# Patient Record
Sex: Female | Born: 2000 | Hispanic: Yes | Smoking: Never smoker
Health system: Southern US, Community
[De-identification: ages and names within clinical notes are randomized; demographics above are authoritative.]

## PROBLEM LIST (undated history)

## (undated) DIAGNOSIS — Z789 Other specified health status: Secondary | ICD-10-CM

## (undated) HISTORY — DX: Other specified health status: Z78.9

## (undated) HISTORY — PX: NO PAST SURGERIES: SHX2092

---
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
## 2022-02-03 NOTE — L&D Delivery Note (Signed)
Kelly Simpson is a 22 y.o. female G1P0000 with IUP at [redacted]w[redacted]d admitted for spontaneous onset of labor.  She progressed with AROM augmentation to complete and pushed 1 hour to deliver.  Cord clamping delayed by several minutes then clamped by CNM and cut by FOB.    Delivery Note At 6:28 PM a viable female was delivered via Vaginal, Spontaneous (Presentation: LOA ).  APGAR: 8, 9; weight pending.   Placenta status: Spontaneous, Intact.  Cord: nuchal x1 and body x1, delivered through and reduced after.     Anesthesia: Epidural Episiotomy:  n/a Lacerations:  none Suture Repair:  n/a Est. Blood Loss (mL):  75  Mom to postpartum.  Baby to Couplet care / Skin to Skin.  Wende Mott CNM 04/18/2022, 6:39 PM

## 2022-02-05 ENCOUNTER — Ambulatory Visit (INDEPENDENT_AMBULATORY_CARE_PROVIDER_SITE_OTHER): Payer: Medicaid Other | Admitting: Obstetrics and Gynecology

## 2022-02-05 ENCOUNTER — Encounter: Payer: Self-pay | Admitting: Obstetrics and Gynecology

## 2022-02-05 ENCOUNTER — Other Ambulatory Visit: Payer: Medicaid Other

## 2022-02-05 VITALS — BP 113/73 | HR 82 | Wt 121.4 lb

## 2022-02-05 DIAGNOSIS — Z3483 Encounter for supervision of other normal pregnancy, third trimester: Secondary | ICD-10-CM | POA: Diagnosis not present

## 2022-02-05 DIAGNOSIS — Z3480 Encounter for supervision of other normal pregnancy, unspecified trimester: Secondary | ICD-10-CM

## 2022-02-05 DIAGNOSIS — Z1332 Encounter for screening for maternal depression: Secondary | ICD-10-CM

## 2022-02-05 DIAGNOSIS — Z3A28 28 weeks gestation of pregnancy: Secondary | ICD-10-CM

## 2022-02-05 NOTE — Progress Notes (Signed)
Patient presents for ROB visit. No concerns at this time.   

## 2022-02-05 NOTE — Progress Notes (Signed)
   LOW-RISK PREGNANCY OFFICE VISIT Patient name: Kelly Simpson MRN [id number]  Date of birth: October 03, 2000 Chief Complaint:   Routine Prenatal Visit  History of Present Illness:   Kelly Simpson is a 22 y.o. G36P0000 female at [redacted]w[redacted]d with an Estimated Date of Delivery: 04/28/22 being seen today for ongoing management of a low-risk pregnancy.  Today she reports no complaints. Contractions: Not present. Vag. Bleeding: None.  Movement: Present. denies leaking of fluid. Review of Systems:   Pertinent items are noted in HPI Denies abnormal vaginal discharge w/ itching/odor/irritation, headaches, visual changes, shortness of breath, chest pain, abdominal pain, severe nausea/vomiting, or problems with urination or bowel movements unless otherwise stated above. Pertinent History Reviewed:  Reviewed past medical,surgical, social, obstetrical and family history.  Reviewed problem list, medications and allergies. Physical Assessment:   Vitals:   02/05/22 0919  BP: 113/73  Pulse: 82  Weight: 121 lb 6.4 oz (55.1 kg)  Body mass index is 22.94 kg/m.        Physical Examination:   General appearance: Well appearing, and in no distress  Mental status: Alert, oriented to person, place, and time  Skin: Warm & dry  Cardiovascular: Normal heart rate noted  Respiratory: Normal respiratory effort, no distress  Abdomen: Soft, gravid, nontender  Pelvic: Cervical exam deferred         Extremities: Edema: None  Fetal Status: Fetal Heart Rate (bpm): 139 Fundal Height: 29 cm Movement: Present    No results found for this or any previous visit (from the past 24 hour(s)).  Assessment & Plan:  1) Low-risk pregnancy G1P0000 at [redacted]w[redacted]d with an Estimated Date of Delivery: 04/28/22   2) Supervision of other normal pregnancy, antepartum - Anticipatory guidance for upcoming appointments  3) [redacted] weeks gestation of pregnancy - CBC - Glucose Tolerance, 2 Hours w/1 Hour - HIV Antibody (routine testing  w rflx) - RPR  - Declined TdaP and flu vaccines   Meds: No orders of the defined types were placed in this encounter.  Labs/procedures today: 2 hr GTT and 3rd trimester  Plan:  Continue routine obstetrical care   Reviewed: Preterm labor symptoms and general obstetric precautions including but not limited to vaginal bleeding, contractions, leaking of fluid and fetal movement were reviewed in detail with the patient.  All questions were answered. Has home bp cuff. Check bp weekly, let us know if >140/90.   Follow-up: Return in about 2 weeks (around 02/19/2022) for Return OB visit.  Orders Placed This Encounter  Procedures   CBC   Glucose Tolerance, 2 Hours w/1 Hour   HIV Antibody (routine testing w rflx)   RPR   Laury Deep MSN, CNM 02/05/2022 9:40 AM

## 2022-02-06 LAB — HIV ANTIBODY (ROUTINE TESTING W REFLEX): HIV Screen 4th Generation wRfx: NONREACTIVE

## 2022-02-06 LAB — GLUCOSE TOLERANCE, 2 HOURS W/ 1HR
Glucose, 1 hour: 131 mg/dL (ref 70–179)
Glucose, 2 hour: 93 mg/dL (ref 70–152)
Glucose, Fasting: 75 mg/dL (ref 70–91)

## 2022-02-06 LAB — CBC
Hematocrit: 36.4 % (ref 34.0–46.6)
Hemoglobin: 11.8 g/dL (ref 11.1–15.9)
MCH: 27.4 pg (ref 26.6–33.0)
MCHC: 32.4 g/dL (ref 31.5–35.7)
MCV: 85 fL (ref 79–97)
Platelets: 281 10*3/uL (ref 150–450)
RBC: 4.31 x10E6/uL (ref 3.77–5.28)
RDW: 13.2 % (ref 11.7–15.4)
WBC: 11.2 10*3/uL — ABNORMAL HIGH (ref 3.4–10.8)

## 2022-02-06 LAB — RPR: RPR Ser Ql: NONREACTIVE

## 2022-02-19 ENCOUNTER — Ambulatory Visit (INDEPENDENT_AMBULATORY_CARE_PROVIDER_SITE_OTHER): Payer: Medicaid Other | Admitting: Obstetrics

## 2022-02-19 ENCOUNTER — Encounter: Payer: Self-pay | Admitting: Obstetrics

## 2022-02-19 VITALS — BP 118/67 | HR 89 | Wt 124.6 lb

## 2022-02-19 DIAGNOSIS — Z3480 Encounter for supervision of other normal pregnancy, unspecified trimester: Secondary | ICD-10-CM

## 2022-02-19 DIAGNOSIS — Z3A30 30 weeks gestation of pregnancy: Secondary | ICD-10-CM

## 2022-02-19 DIAGNOSIS — Z3483 Encounter for supervision of other normal pregnancy, third trimester: Secondary | ICD-10-CM

## 2022-02-19 NOTE — Progress Notes (Signed)
Pt presents for ROB visit. No concerns at this time.  

## 2022-02-19 NOTE — Progress Notes (Signed)
Subjective:  Kelly Simpson is a 22 y.o. G1P0000 at [redacted]w[redacted]d being seen today for ongoing prenatal care.  She is currently monitored for the following issues for this low-risk pregnancy and has Supervision of other normal pregnancy, antepartum on their problem list.  Patient reports no complaints.  Contractions: Not present. Vag. Bleeding: None.  Movement: Present. Denies leaking of fluid.   The following portions of the patient's history were reviewed and updated as appropriate: allergies, current medications, past family history, past medical history, past social history, past surgical history and problem list. Problem list updated.  Objective:   Vitals:   02/19/22 1627  BP: 118/67  Pulse: 89  Weight: 124 lb 9.6 oz (56.5 kg)    Fetal Status: Fetal Heart Rate (bpm): 152   Movement: Present     General:  Alert, oriented and cooperative. Patient is in no acute distress.  Skin: Skin is warm and dry. No rash noted.   Cardiovascular: Normal heart rate noted  Respiratory: Normal respiratory effort, no problems with respiration noted  Abdomen: Soft, gravid, appropriate for gestational age. Pain/Pressure: Absent     Pelvic:  Cervical exam deferred        Extremities: Normal range of motion.  Edema: None  Mental Status: Normal mood and affect. Normal behavior. Normal judgment and thought content.   Urinalysis:      Assessment and Plan:  Pregnancy: G1P0000 at [redacted]w[redacted]d  1. Supervision of other normal pregnancy, antepartum   Preterm labor symptoms and general obstetric precautions including but not limited to vaginal bleeding, contractions, leaking of fluid and fetal movement were reviewed in detail with the patient. Please refer to After Visit Summary for other counseling recommendations.   Return in about 4 weeks (around 03/19/2022) for ROB.   Shelly Bombard, MD [phone]

## 2022-03-05 ENCOUNTER — Encounter: Payer: Self-pay | Admitting: Obstetrics

## 2022-03-05 ENCOUNTER — Ambulatory Visit (INDEPENDENT_AMBULATORY_CARE_PROVIDER_SITE_OTHER): Payer: Medicaid Other | Admitting: Obstetrics

## 2022-03-05 VITALS — BP 111/69 | HR 79 | Wt 128.9 lb

## 2022-03-05 DIAGNOSIS — Z3480 Encounter for supervision of other normal pregnancy, unspecified trimester: Secondary | ICD-10-CM

## 2022-03-05 DIAGNOSIS — Z3A32 32 weeks gestation of pregnancy: Secondary | ICD-10-CM

## 2022-03-05 DIAGNOSIS — Z3483 Encounter for supervision of other normal pregnancy, third trimester: Secondary | ICD-10-CM

## 2022-03-05 NOTE — Progress Notes (Signed)
Patient presents for ROB. 

## 2022-03-05 NOTE — Progress Notes (Unsigned)
Subjective:  Kelly Simpson is a 22 y.o. G1P0000 at [redacted]w[redacted]d being seen today for ongoing prenatal care.  She is currently monitored for the following issues for this {Blank single:19197::"high-risk","low-risk"} pregnancy and has Supervision of other normal pregnancy, antepartum on their problem list.  Patient reports {sx:14538}.  Contractions: Not present. Vag. Bleeding: None.  Movement: Present. Denies leaking of fluid.   The following portions of the patient's history were reviewed and updated as appropriate: allergies, current medications, past family history, past medical history, past social history, past surgical history and problem list. Problem list updated.  Objective:   Vitals:   03/05/22 1553  BP: 111/69  Pulse: 79  Weight: 128 lb 14.4 oz (58.5 kg)    Fetal Status: Fetal Heart Rate (bpm): 145   Movement: Present     General:  Alert, oriented and cooperative. Patient is in no acute distress.  Skin: Skin is warm and dry. No rash noted.   Cardiovascular: Normal heart rate noted  Respiratory: Normal respiratory effort, no problems with respiration noted  Abdomen: Soft, gravid, appropriate for gestational age. Pain/Pressure: Absent     Pelvic:  {Blank single:19197::"Cervical exam performed","Cervical exam deferred"}        Extremities: Normal range of motion.  Edema: None  Mental Status: Normal mood and affect. Normal behavior. Normal judgment and thought content.   Urinalysis:      Assessment and Plan:  Pregnancy: G1P0000 at [redacted]w[redacted]d  1. Supervision of other normal pregnancy, antepartum ***  {Blank single:19197::"Term","Preterm"} labor symptoms and general obstetric precautions including but not limited to vaginal bleeding, contractions, leaking of fluid and fetal movement were reviewed in detail with the patient. Please refer to After Visit Summary for other counseling recommendations.   Return in about 2 weeks (around 03/19/2022) for ROB.   Shelly Bombard,  MD 03/05/2022

## 2022-03-19 ENCOUNTER — Encounter: Payer: Medicaid Other | Admitting: Obstetrics and Gynecology

## 2022-03-20 ENCOUNTER — Encounter: Payer: Self-pay | Admitting: Obstetrics

## 2022-03-20 ENCOUNTER — Ambulatory Visit (INDEPENDENT_AMBULATORY_CARE_PROVIDER_SITE_OTHER): Payer: Medicaid Other | Admitting: Obstetrics

## 2022-03-20 VITALS — BP 110/70 | HR 76 | Wt 130.7 lb

## 2022-03-20 DIAGNOSIS — Z3480 Encounter for supervision of other normal pregnancy, unspecified trimester: Secondary | ICD-10-CM

## 2022-03-20 DIAGNOSIS — Z3A34 34 weeks gestation of pregnancy: Secondary | ICD-10-CM

## 2022-03-20 DIAGNOSIS — Z3483 Encounter for supervision of other normal pregnancy, third trimester: Secondary | ICD-10-CM

## 2022-03-20 NOTE — Progress Notes (Signed)
Pt reports fetal movement, denies pain.

## 2022-03-20 NOTE — Progress Notes (Signed)
Subjective:  Kelly Simpson is a 22 y.o. G1P0000 at 74w3dbeing seen today for ongoing prenatal care.  She is currently monitored for the following issues for this low-risk pregnancy and has Supervision of other normal pregnancy, antepartum on their problem list.  Patient reports no complaints.  Contractions: Not present. Vag. Bleeding: None.  Movement: Present. Denies leaking of fluid.   The following portions of the patient's history were reviewed and updated as appropriate: allergies, current medications, past family history, past medical history, past social history, past surgical history and problem list. Problem list updated.  Objective:   Vitals:   03/20/22 0857  BP: 110/70  Pulse: 76  Weight: 130 lb 11.2 oz (59.3 kg)    Fetal Status: Fetal Heart Rate (bpm): 134   Movement: Present     General:  Alert, oriented and cooperative. Patient is in no acute distress.  Skin: Skin is warm and dry. No rash noted.   Cardiovascular: Normal heart rate noted  Respiratory: Normal respiratory effort, no problems with respiration noted  Abdomen: Soft, gravid, appropriate for gestational age. Pain/Pressure: Absent     Pelvic:  Cervical exam deferred        Extremities: Normal range of motion.  Edema: None  Mental Status: Normal mood and affect. Normal behavior. Normal judgment and thought content.   Urinalysis:      Assessment and Plan:  Pregnancy: G1P0000 at 366w3dThere are no diagnoses linked to this encounter. Preterm labor symptoms and general obstetric precautions including but not limited to vaginal bleeding, contractions, leaking of fluid and fetal movement were reviewed in detail with the patient. Please refer to After Visit Summary for other counseling recommendations.   Return in about 2 weeks (around 04/03/2022) for ROB.   HaShelly BombardMD 03/20/22

## 2022-04-02 ENCOUNTER — Other Ambulatory Visit (HOSPITAL_COMMUNITY)
Admission: RE | Admit: 2022-04-02 | Discharge: 2022-04-02 | Disposition: A | Discharge status: Home / Self Care | Payer: Medicaid Other | Source: Ambulatory Visit | Attending: Obstetrics and Gynecology | Admitting: Obstetrics and Gynecology

## 2022-04-02 ENCOUNTER — Ambulatory Visit (INDEPENDENT_AMBULATORY_CARE_PROVIDER_SITE_OTHER): Payer: Medicaid Other | Admitting: Obstetrics and Gynecology

## 2022-04-02 ENCOUNTER — Encounter: Payer: Self-pay | Admitting: Obstetrics and Gynecology

## 2022-04-02 VITALS — BP 126/73 | HR 88 | Wt 134.2 lb

## 2022-04-02 DIAGNOSIS — Z3403 Encounter for supervision of normal first pregnancy, third trimester: Secondary | ICD-10-CM

## 2022-04-02 DIAGNOSIS — Z3480 Encounter for supervision of other normal pregnancy, unspecified trimester: Secondary | ICD-10-CM

## 2022-04-02 DIAGNOSIS — Z3A36 36 weeks gestation of pregnancy: Secondary | ICD-10-CM

## 2022-04-02 NOTE — Progress Notes (Signed)
   LOW-RISK PREGNANCY OFFICE VISIT Patient name: Kelly Simpson MRN [id number]  Date of birth: [birth year] Chief Complaint:   Routine Prenatal Visit  History of Present Illness:   Kelly Simpson is a 22 y.o. G58P0000 female at 3w2dwith an Estimated Date of Delivery: 04/28/22 being seen today for ongoing management of a low-risk pregnancy.  Today she reports no complaints. Contractions: Not present. Vag. Bleeding: None.  Movement: Present. denies leaking of fluid. Review of Systems:   Pertinent items are noted in HPI Denies abnormal vaginal discharge w/ itching/odor/irritation, headaches, visual changes, shortness of breath, chest pain, abdominal pain, severe nausea/vomiting, or problems with urination or bowel movements unless otherwise stated above. Pertinent History Reviewed:  Reviewed past medical,surgical, social, obstetrical and family history.  Reviewed problem list, medications and allergies. Physical Assessment:   Vitals:   04/02/22 1544  BP: 126/73  Pulse: 88  Weight: 134 lb 3.2 oz (60.9 kg)  Body mass index is 25.36 kg/m.        Physical Examination:   General appearance: Well appearing, and in no distress  Mental status: Alert, oriented to person, place, and time  Skin: Warm & dry  Cardiovascular: Normal heart rate noted  Respiratory: Normal respiratory effort, no distress  Abdomen: Soft, gravid, nontender  Pelvic: Cervical exam deferred         Extremities: Edema: None  Fetal Status: Fetal Heart Rate (bpm): 138 Fundal Height: 35 cm Movement: Present Presentation: Vertex  No results found for this or any previous visit (from the past 24 hour(s)).  Assessment & Plan:  1) Low-risk pregnancy G1P0000 at 319w2dith an Estimated Date of Delivery: 04/28/22   2) Supervision of other normal pregnancy, antepartum - Culture, beta strep (group b only) - Cervicovaginal ancillary only( [place] 3) [redacted] weeks gestation of pregnancy     Meds: No orders of  the defined types were placed in this encounter.  Labs/procedures today: GBS and GC/CT  Plan:  Continue routine obstetrical care   Reviewed: Preterm labor symptoms and general obstetric precautions including but not limited to vaginal bleeding, contractions, leaking of fluid and fetal movement were reviewed in detail with the patient.  All questions were answered. Has home bp cuff. Check bp weekly, let usKoreanow if >140/90.   Follow-up: Return in about 1 week (around 04/09/2022) for Return OB visit.  Orders Placed This Encounter  Procedures   Culture, beta strep (group b only)   RoLaury DeepSN, CNM 04/02/2022 4:00 PM

## 2022-04-06 LAB — CULTURE, BETA STREP (GROUP B ONLY): Strep Gp B Culture: NEGATIVE

## 2022-04-07 LAB — CERVICOVAGINAL ANCILLARY ONLY
Chlamydia: NEGATIVE
Comment: NEGATIVE
Comment: NEGATIVE
Comment: NORMAL
Neisseria Gonorrhea: NEGATIVE
Trichomonas: NEGATIVE

## 2022-04-09 ENCOUNTER — Ambulatory Visit (INDEPENDENT_AMBULATORY_CARE_PROVIDER_SITE_OTHER): Payer: Medicaid Other | Admitting: Obstetrics and Gynecology

## 2022-04-09 VITALS — BP 114/72 | HR 87 | Wt 135.4 lb

## 2022-04-09 DIAGNOSIS — Z3480 Encounter for supervision of other normal pregnancy, unspecified trimester: Secondary | ICD-10-CM

## 2022-04-09 DIAGNOSIS — Z3483 Encounter for supervision of other normal pregnancy, third trimester: Secondary | ICD-10-CM

## 2022-04-09 DIAGNOSIS — Z3A37 37 weeks gestation of pregnancy: Secondary | ICD-10-CM

## 2022-04-09 NOTE — Progress Notes (Signed)
   PRENATAL VISIT NOTE  Subjective:  Kelly Simpson is a 22 y.o. G1P0000 at 66w2dbeing seen today for ongoing prenatal care.  She is currently monitored for the following issues for this low-risk pregnancy and has Supervision of other normal pregnancy, antepartum on their problem list.  Patient doing well with no acute concerns today. She reports no complaints.  Contractions: Not present. Vag. Bleeding: None.  Movement: Present. Denies leaking of fluid.   The following portions of the patient's history were reviewed and updated as appropriate: allergies, current medications, past family history, past medical history, past social history, past surgical history and problem list. Problem list updated.  Objective:   Vitals:   04/09/22 1548 04/09/22 1554  BP:  114/72  Pulse:  87  Weight: 135 lb 6.4 oz (61.4 kg) 135 lb 6.4 oz (61.4 kg)    Fetal Status: Fetal Heart Rate (bpm): 150 Fundal Height: 37 cm Movement: Present     General:  Alert, oriented and cooperative. Patient is in no acute distress.  Skin: Skin is warm and dry. No rash noted.   Cardiovascular: Normal heart rate noted  Respiratory: Normal respiratory effort, no problems with respiration noted  Abdomen: Soft, gravid, appropriate for gestational age.  Pain/Pressure: Absent     Pelvic: Cervical exam deferred        Extremities: Normal range of motion.  Edema: None  Mental Status:  Normal mood and affect. Normal behavior. Normal judgment and thought content.   Assessment and Plan:  Pregnancy: G1P0000 at 359w2d1. Supervision of other normal pregnancy, antepartum Continue routine prenatal care  2. [redacted] weeks gestation of pregnancy   Term labor symptoms and general obstetric precautions including but not limited to vaginal bleeding, contractions, leaking of fluid and fetal movement were reviewed in detail with the patient.  Please refer to After Visit Summary for other counseling recommendations.   Return in about  1 week (around 04/16/2022) for ROB, in person.   LaLynnda ShieldsMD Faculty Attending Center for [facility]

## 2022-04-09 NOTE — Progress Notes (Signed)
Pt presents for ROB. No concerns today.

## 2022-04-16 ENCOUNTER — Ambulatory Visit (INDEPENDENT_AMBULATORY_CARE_PROVIDER_SITE_OTHER): Payer: Medicaid Other | Admitting: Obstetrics and Gynecology

## 2022-04-16 VITALS — BP 124/78 | HR 90 | Wt 135.4 lb

## 2022-04-16 DIAGNOSIS — Z3A38 38 weeks gestation of pregnancy: Secondary | ICD-10-CM | POA: Diagnosis not present

## 2022-04-16 DIAGNOSIS — Z3483 Encounter for supervision of other normal pregnancy, third trimester: Secondary | ICD-10-CM | POA: Diagnosis not present

## 2022-04-16 DIAGNOSIS — Z1332 Encounter for screening for maternal depression: Secondary | ICD-10-CM

## 2022-04-16 DIAGNOSIS — Z3480 Encounter for supervision of other normal pregnancy, unspecified trimester: Secondary | ICD-10-CM

## 2022-04-16 NOTE — Progress Notes (Signed)
   PRENATAL VISIT NOTE  Subjective:  Kelly Simpson is a 22 y.o. G1P0000 at [redacted]w[redacted]d being seen today for ongoing prenatal care.  She is currently monitored for the following issues for this low-risk pregnancy and has Supervision of other normal pregnancy, antepartum on their problem list.  Patient reports no complaints.  Contractions: Not present. Vag. Bleeding: None.  Movement: Present. Denies leaking of fluid.   The following portions of the patient's history were reviewed and updated as appropriate: allergies, current medications, past family history, past medical history, past social history, past surgical history and problem list.   Objective:   Vitals:   04/16/22 1518  BP: 124/78  Pulse: 90  Weight: 135 lb 6.4 oz (61.4 kg)    Fetal Status: Fetal Heart Rate (bpm): 145 Fundal Height: 38 cm Movement: Present     General:  Alert, oriented and cooperative. Patient is in no acute distress.  Skin: Skin is warm and dry. No rash noted.   Cardiovascular: Normal heart rate noted  Respiratory: Normal respiratory effort, no problems with respiration noted  Abdomen: Soft, gravid, appropriate for gestational age.  Pain/Pressure: Absent      Assessment and Plan:  Pregnancy: G1P0000 at [redacted]w[redacted]d 1. Supervision of other normal pregnancy, antepartum 2. [redacted] weeks gestation of pregnancy Doing well Discussed pdIOL at 41w if no SOL  Term labor symptoms and general obstetric precautions including but not limited to vaginal bleeding, contractions, leaking of fluid and fetal movement were reviewed/  Future Appointments  Date Time Provider [place]  04/23/2022  3:30 PM Laury Deep, [place] None   Inez Catalina, MD

## 2022-04-16 NOTE — Progress Notes (Signed)
Pt presents for ROB visit. No concerns at this time.  

## 2022-04-18 ENCOUNTER — Inpatient Hospital Stay (HOSPITAL_COMMUNITY): Payer: Medicaid Other | Admitting: Anesthesiology

## 2022-04-18 ENCOUNTER — Encounter (HOSPITAL_COMMUNITY): Payer: Self-pay | Admitting: Obstetrics and Gynecology

## 2022-04-18 ENCOUNTER — Other Ambulatory Visit: Payer: Self-pay

## 2022-04-18 ENCOUNTER — Inpatient Hospital Stay (EMERGENCY_DEPARTMENT_HOSPITAL)
Admission: AD | Admit: 2022-04-18 | Discharge: 2022-04-18 | Disposition: A | Discharge status: Home / Self Care | Payer: Medicaid Other | Source: Home / Self Care | Attending: Obstetrics and Gynecology | Admitting: Obstetrics and Gynecology

## 2022-04-18 ENCOUNTER — Inpatient Hospital Stay (HOSPITAL_COMMUNITY)
Admission: AD | Admit: 2022-04-18 | Discharge: 2022-04-20 | DRG: 807 | Disposition: A | Discharge status: Home / Self Care | Payer: Medicaid Other | Attending: Obstetrics and Gynecology | Admitting: Obstetrics and Gynecology

## 2022-04-18 DIAGNOSIS — Z3A38 38 weeks gestation of pregnancy: Secondary | ICD-10-CM

## 2022-04-18 DIAGNOSIS — Z3480 Encounter for supervision of other normal pregnancy, unspecified trimester: Principal | ICD-10-CM

## 2022-04-18 DIAGNOSIS — O471 False labor at or after 37 completed weeks of gestation: Secondary | ICD-10-CM

## 2022-04-18 DIAGNOSIS — O26893 Other specified pregnancy related conditions, third trimester: Secondary | ICD-10-CM | POA: Diagnosis present

## 2022-04-18 LAB — TYPE AND SCREEN
ABO/RH(D): O POS
Antibody Screen: NEGATIVE

## 2022-04-18 LAB — CBC
HCT: 36.4 % (ref 36.0–46.0)
Hemoglobin: 11.8 g/dL — ABNORMAL LOW (ref 12.0–15.0)
MCH: 25.3 pg — ABNORMAL LOW (ref 26.0–34.0)
MCHC: 32.4 g/dL (ref 30.0–36.0)
MCV: 78.1 fL — ABNORMAL LOW (ref 80.0–100.0)
Platelets: 273 10*3/uL (ref 150–400)
RBC: 4.66 MIL/uL (ref 3.87–5.11)
RDW: 15 % (ref 11.5–15.5)
WBC: 17 10*3/uL — ABNORMAL HIGH (ref 4.0–10.5)
nRBC: 0 % (ref 0.0–0.2)

## 2022-04-18 MED ORDER — EPHEDRINE 5 MG/ML INJ: 10.0000 mg | INTRAVENOUS | Status: DC | PRN

## 2022-04-18 MED ORDER — IBUPROFEN 600 MG PO TABS
600.0000 mg | ORAL_TABLET | Freq: Four times a day (QID) | ORAL | Status: DC
  Administered 2022-04-19 – 2022-04-20 (×4): 600 mg via ORAL
  Filled 2022-04-18 (×5): qty 1

## 2022-04-18 MED ORDER — SIMETHICONE 80 MG PO CHEW: 80.0000 mg | CHEWABLE_TABLET | ORAL | Status: DC | PRN

## 2022-04-18 MED ORDER — OXYTOCIN BOLUS FROM INFUSION
333.0000 mL | Freq: Once | INTRAVENOUS | Status: AC
  Administered 2022-04-18: 333 mL via INTRAVENOUS

## 2022-04-18 MED ORDER — COCONUT OIL OIL
1.0000 | TOPICAL_OIL | Status: DC | PRN
  Administered 2022-04-19: 1 via TOPICAL

## 2022-04-18 MED ORDER — OXYTOCIN-SODIUM CHLORIDE 30-0.9 UT/500ML-% IV SOLN
2.5000 [IU]/h | INTRAVENOUS | Status: DC
  Administered 2022-04-18: 2.5 [IU]/h via INTRAVENOUS

## 2022-04-18 MED ORDER — FENTANYL-BUPIVACAINE-NACL 0.5-0.125-0.9 MG/250ML-% EP SOLN
12.0000 mL/h | EPIDURAL | Status: DC | PRN
  Administered 2022-04-18: 12 mL/h via EPIDURAL

## 2022-04-18 MED ORDER — DIPHENHYDRAMINE HCL 25 MG PO CAPS: 25.0000 mg | ORAL_CAPSULE | Freq: Four times a day (QID) | ORAL | Status: DC | PRN

## 2022-04-18 MED ORDER — LIDOCAINE HCL (PF) 1 % IJ SOLN: 30.0000 mL | INTRAMUSCULAR | Status: DC | PRN

## 2022-04-18 MED ORDER — WITCH HAZEL-GLYCERIN EX PADS: 1.0000 | MEDICATED_PAD | CUTANEOUS | Status: DC | PRN

## 2022-04-18 MED ORDER — LACTATED RINGERS IV SOLN: INTRAVENOUS | Status: DC

## 2022-04-18 MED ORDER — SENNOSIDES-DOCUSATE SODIUM 8.6-50 MG PO TABS
2.0000 | ORAL_TABLET | Freq: Every day | ORAL | Status: DC
  Administered 2022-04-19 – 2022-04-20 (×2): 2 via ORAL
  Filled 2022-04-18 (×2): qty 2

## 2022-04-18 MED ORDER — OXYCODONE-ACETAMINOPHEN 5-325 MG PO TABS: 2.0000 | ORAL_TABLET | ORAL | Status: DC | PRN

## 2022-04-18 MED ORDER — TETANUS-DIPHTH-ACELL PERTUSSIS 5-2.5-18.5 LF-MCG/0.5 IM SUSY
0.5000 mL | PREFILLED_SYRINGE | Freq: Once | INTRAMUSCULAR | Status: DC
  Filled 2022-04-18: qty 0.5

## 2022-04-18 MED ORDER — ACETAMINOPHEN 325 MG PO TABS: 650.0000 mg | ORAL_TABLET | ORAL | Status: DC | PRN

## 2022-04-18 MED ORDER — ONDANSETRON HCL 4 MG/2ML IJ SOLN: 4.0000 mg | INTRAMUSCULAR | Status: DC | PRN

## 2022-04-18 MED ORDER — ZOLPIDEM TARTRATE 5 MG PO TABS: 5.0000 mg | ORAL_TABLET | Freq: Every evening | ORAL | Status: DC | PRN

## 2022-04-18 MED ORDER — DIBUCAINE (PERIANAL) 1 % EX OINT: 1.0000 | TOPICAL_OINTMENT | CUTANEOUS | Status: DC | PRN

## 2022-04-18 MED ORDER — BENZOCAINE-MENTHOL 20-0.5 % EX AERO: 1.0000 | INHALATION_SPRAY | CUTANEOUS | Status: DC | PRN

## 2022-04-18 MED ORDER — PHENYLEPHRINE 80 MCG/ML (10ML) SYRINGE FOR IV PUSH (FOR BLOOD PRESSURE SUPPORT): 80.0000 ug | PREFILLED_SYRINGE | INTRAVENOUS | Status: DC | PRN

## 2022-04-18 MED ORDER — FENTANYL-BUPIVACAINE-NACL 0.5-0.125-0.9 MG/250ML-% EP SOLN
EPIDURAL | Status: AC
  Filled 2022-04-18: qty 250

## 2022-04-18 MED ORDER — ONDANSETRON HCL 4 MG PO TABS: 4.0000 mg | ORAL_TABLET | ORAL | Status: DC | PRN

## 2022-04-18 MED ORDER — PRENATAL MULTIVITAMIN CH
1.0000 | ORAL_TABLET | Freq: Every day | ORAL | Status: DC
  Administered 2022-04-19 – 2022-04-20 (×2): 1 via ORAL
  Filled 2022-04-18 (×2): qty 1

## 2022-04-18 MED ORDER — OXYCODONE-ACETAMINOPHEN 5-325 MG PO TABS: 1.0000 | ORAL_TABLET | ORAL | Status: DC | PRN

## 2022-04-18 MED ORDER — SOD CITRATE-CITRIC ACID 500-334 MG/5ML PO SOLN: 30.0000 mL | ORAL | Status: DC | PRN

## 2022-04-18 MED ORDER — LACTATED RINGERS IV SOLN: 500.0000 mL | INTRAVENOUS | Status: DC | PRN

## 2022-04-18 MED ORDER — ONDANSETRON HCL 4 MG/2ML IJ SOLN: 4.0000 mg | Freq: Four times a day (QID) | INTRAMUSCULAR | Status: DC | PRN

## 2022-04-18 MED ORDER — LACTATED RINGERS IV SOLN
500.0000 mL | Freq: Once | INTRAVENOUS | Status: AC
  Administered 2022-04-18: 500 mL via INTRAVENOUS

## 2022-04-18 MED ORDER — SODIUM CHLORIDE 0.9 % IV SOLN
12.5000 mg | Freq: Once | INTRAVENOUS | Status: AC
  Administered 2022-04-18: 12.5 mg via INTRAVENOUS
  Filled 2022-04-18: qty 12.5

## 2022-04-18 MED ORDER — FENTANYL CITRATE (PF) 100 MCG/2ML IJ SOLN
100.0000 ug | Freq: Once | INTRAMUSCULAR | Status: AC
  Administered 2022-04-18: 100 ug via INTRAVENOUS
  Filled 2022-04-18: qty 2

## 2022-04-18 MED ORDER — LIDOCAINE HCL (PF) 1 % IJ SOLN
INTRAMUSCULAR | Status: DC | PRN
  Administered 2022-04-18: 5 mL via EPIDURAL
  Administered 2022-04-18: 4 mL via EPIDURAL

## 2022-04-18 MED ORDER — OXYTOCIN-SODIUM CHLORIDE 30-0.9 UT/500ML-% IV SOLN
INTRAVENOUS | Status: AC
  Filled 2022-04-18: qty 500

## 2022-04-18 MED ORDER — DIPHENHYDRAMINE HCL 50 MG/ML IJ SOLN: 12.5000 mg | INTRAMUSCULAR | Status: DC | PRN

## 2022-04-18 MED ORDER — FLEET ENEMA 7-19 GM/118ML RE ENEM: 1.0000 | ENEMA | RECTAL | Status: DC | PRN

## 2022-04-18 NOTE — MAU Note (Signed)
1359- SVE 9/100 fetal presentation Vertex.   1402- FHR 140, monitors removed. Patient transferred to LD 205 by this RN and Jeanett Schlein CNM by stretcher, support person at bedside.

## 2022-04-18 NOTE — MAU Note (Signed)
.  Kelly Simpson is a 22 y.o. at [redacted]w[redacted]d here in MAU reporting ctxs since 1600 Thurs. Denies LOF or VB. Reports good FM. Cervix closed on WEds  Onset of complaint: 1600 Pain score: 8 Vitals:   04/18/22 0552 04/18/22 0554  BP:  123/75  Pulse: 67   Resp: 18   Temp: 98.4 F (36.9 C)   SpO2: 100%      FHT:134 Lab orders placed from triage:  mau labor eval

## 2022-04-18 NOTE — MAU Note (Signed)
.  Kelly Simpson is a 22 y.o. at [redacted]w[redacted]d here in MAU reporting: ctx that have intensified since she left. Also passed some mucus in the toilet with some bloody show. Denies LOF. DFM since ctx started.   Pain score: 10 Vitals:   04/18/22 1350  BP: 133/80  Resp: 14  Temp: 98.2 F (36.8 C)  SpO2: 99%     FHT:138 Lab orders placed from triage:  mau labor

## 2022-04-18 NOTE — Anesthesia Preprocedure Evaluation (Addendum)
Anesthesia Evaluation  Patient identified by MRN, date of birth, ID band Patient awake    Reviewed: Allergy & Precautions, NPO status , Patient's Chart, lab work & pertinent test results  History of Anesthesia Complications Negative for: history of anesthetic complications  Airway Mallampati: II   Neck ROM: Full    Dental   Pulmonary neg pulmonary ROS   Pulmonary exam normal        Cardiovascular negative cardio ROS Normal cardiovascular exam     Neuro/Psych negative neurological ROS  negative psych ROS   GI/Hepatic negative GI ROS, Neg liver ROS,,,  Endo/Other  negative endocrine ROS    Renal/GU negative Renal ROS     Musculoskeletal negative musculoskeletal ROS (+)    Abdominal   Peds  Hematology  (+) Blood dyscrasia, anemia   Anesthesia Other Findings   Reproductive/Obstetrics (+) Pregnancy                              Anesthesia Physical Anesthesia Plan  ASA: 2  Anesthesia Plan: Epidural   Post-op Pain Management:    Induction:   PONV Risk Score and Plan: 2 and Treatment may vary due to age or medical condition  Airway Management Planned: Natural Airway  Additional Equipment: None  Intra-op Plan:   Post-operative Plan:   Informed Consent: I have reviewed the patients History and Physical, chart, labs and discussed the procedure including the risks, benefits and alternatives for the proposed anesthesia with the patient or authorized representative who has indicated his/her understanding and acceptance.       Plan Discussed with: Anesthesiologist  Anesthesia Plan Comments: (Labs reviewed. Platelets acceptable, patient not taking any blood thinning medications. Per RN, FHR tracing reported to be stable enough for sitting procedure. Risks and benefits discussed with patient, including PDPH, backache, epidural hematoma, failed epidural, blood pressure changes, allergic  reaction, and nerve injury. Patient expressed understanding and wished to proceed. Also discussed blood products with patient given chart alert stating patient refuses blood products. Patient is accepting of blood products if needed. RN in room confirmed patient had also agreed to blood products earlier today.)         Anesthesia Quick Evaluation

## 2022-04-18 NOTE — Discharge Instructions (Signed)

## 2022-04-18 NOTE — Anesthesia Procedure Notes (Signed)
Epidural Patient location during procedure: OB Start time: 04/18/2022 3:17 PM End time: 04/18/2022 3:20 PM  Staffing Anesthesiologist: Audry Pili, MD Performed: anesthesiologist   Preanesthetic Checklist Completed: patient identified, IV checked, risks and benefits discussed, monitors and equipment checked, pre-op evaluation and timeout performed  Epidural Patient position: sitting Prep: DuraPrep Patient monitoring: continuous pulse ox and blood pressure Approach: midline Location: L2-L3 Injection technique: LOR saline  Needle:  Needle type: Tuohy  Needle gauge: 17 G Needle length: 9 cm Needle insertion depth: 4 cm Catheter size: 19 Gauge Catheter at skin depth: 9 cm Test dose: negative and Other (1% lidocaine)  Assessment Events: blood not aspirated and no cerebrospinal fluid  Additional Notes Patient identified. Risks including, but not limited to, bleeding, infection, nerve damage, paralysis, inadequate analgesia, blood pressure changes, nausea, vomiting, allergic reaction, postpartum back pain, itching, and headache were discussed. Patient expressed understanding and wished to proceed. Sterile prep and drape, including hand hygiene, mask, and sterile gloves were used. The patient was positioned and the spine was prepped. The skin was anesthetized with lidocaine. No paraesthesia or other complication noted. The patient did not experience any signs of intravascular injection such as tinnitus or metallic taste in mouth, nor signs of intrathecal spread such as rapid motor block. Please see nursing notes for vital signs. The patient tolerated the procedure well.   Renold Don, MDReason for block:procedure for pain

## 2022-04-18 NOTE — Discharge Summary (Shared)
Postpartum Discharge Summary  Date of Service updated***     Patient Name: Kelly Simpson DOB: [birth year] MRN: [id number]  Date of admission: 04/18/2022 Delivery date:04/18/2022  Delivering provider: Wende Mott  Date of discharge: 04/18/2022  Admitting diagnosis: Normal labor [O80, Z37.9] Intrauterine pregnancy: [redacted]w[redacted]d     Secondary diagnosis:  Principal Problem:   Normal labor  Additional problems: ***    Discharge diagnosis: Term Pregnancy Delivered                                              Post partum procedures:{Postpartum procedures:23558} Augmentation: AROM Complications: None  Hospital course: Onset of Labor With Vaginal Delivery      22 y.o. yo G1P0000 at [redacted]w[redacted]d was admitted in Active Labor on 04/18/2022. Labor course was complicated by thick meconium stained fluid  Membrane Rupture Time/Date: 2:22 PM ,04/18/2022   Delivery Method:Vaginal, Spontaneous  Episiotomy:   Lacerations:    Patient had a postpartum course complicated by ***.  She is ambulating, tolerating a regular diet, passing flatus, and urinating well. Patient is discharged home in stable condition on 04/18/22.  Newborn Data: Birth date:04/18/2022  Birth time:6:28 PM  Gender:Female  Living status:  Apgars: ,  Weight:   Magnesium Sulfate received: No BMZ received: No Rhophylac:N/A MMR:N/A T-DaP: declined Flu: No Transfusion:{Transfusion received:30440034}  Physical exam  Vitals:   04/18/22 1545 04/18/22 1550 04/18/22 1555 04/18/22 1730  BP: (!) 114/57 110/67 105/61   Pulse: 86 67 77   Resp:      Temp:    98.7 F (37.1 C)  TempSrc:    Oral  SpO2: 100% 100% 100%   Weight:  62.1 kg    Height:  5\' 2"  (1.575 m)     General: {Exam; general:21111117} Lochia: {Desc; appropriate/inappropriate:30686::"appropriate"} Uterine Fundus: {Desc; firm/soft:30687} Incision: {Exam; incision:21111123} DVT Evaluation: {Exam; dvt:2111122} Labs: Lab Results  Component Value Date   WBC  17.0 (H) 04/18/2022   HGB 11.8 (L) 04/18/2022   HCT 36.4 04/18/2022   MCV 78.1 (L) 04/18/2022   PLT 273 04/18/2022      Latest Ref Rng & Units 10/14/2021    5:38 PM  CMP  Glucose 70 - 99 mg/dL 92   BUN 6 - 20 mg/dL 5   Creatinine 0.44 - 1.00 mg/dL 0.40   Sodium 135 - 145 mmol/L 135   Potassium 3.5 - 5.1 mmol/L 3.3   Chloride 98 - 111 mmol/L 106   CO2 22 - 32 mmol/L 21   Calcium 8.9 - 10.3 mg/dL 9.4    Edinburgh Score:     No data to display           After visit meds:  Allergies as of 04/18/2022   No Known Allergies   Med Rec must be completed prior to using this [facility]***        Discharge home in stable condition Infant Feeding: Bottle and Breast Infant Disposition:home with mother Discharge instruction: per After Visit Summary and Postpartum booklet. Activity: Advance as tolerated. Pelvic rest for 6 weeks.  Diet: routine diet Future Appointments: Future Appointments  Date Time Provider [place]  04/23/2022  3:30 PM Laury Deep, CNM [place] None   Follow up Visit:   Please schedule this patient for a In person postpartum visit in 4 weeks with the following provider: Any provider. Additional Postpartum F/U: n/a  Low risk pregnancy complicated by:  n/a Delivery mode:  Vaginal, Spontaneous  Anticipated Birth Control:  Nexplanon   04/18/2022 Wende Mott, CNM

## 2022-04-18 NOTE — Plan of Care (Signed)
  Problem: Education: Goal: Knowledge of General Education information will improve Description: Including pain rating scale, medication(s)/side effects and non-pharmacologic comfort measures Outcome: Progressing   Problem: Health Behavior/Discharge Planning: Goal: Ability to manage health-related needs will improve Outcome: Progressing   Problem: Clinical Measurements: Goal: Ability to maintain clinical measurements within normal limits will improve Outcome: Progressing Goal: Will remain free from infection Outcome: Progressing Goal: Diagnostic test results will improve Outcome: Progressing Goal: Respiratory complications will improve Outcome: Progressing Goal: Cardiovascular complication will be avoided Outcome: Progressing   Problem: Activity: Goal: Risk for activity intolerance will decrease Outcome: Progressing   Problem: Nutrition: Goal: Adequate nutrition will be maintained Outcome: Progressing   Problem: Coping: Goal: Level of anxiety will decrease Outcome: Progressing   Problem: Elimination: Goal: Will not experience complications related to bowel motility Outcome: Progressing Goal: Will not experience complications related to urinary retention Outcome: Progressing   Problem: Pain Managment: Goal: General experience of comfort will improve Outcome: Progressing   Problem: Safety: Goal: Ability to remain free from injury will improve Outcome: Progressing   Problem: Skin Integrity: Goal: Risk for impaired skin integrity will decrease Outcome: Progressing   Problem: Education: Goal: Knowledge of condition will improve Outcome: Progressing Goal: Individualized Educational Video(s) Outcome: Progressing Goal: Individualized Newborn Educational Video(s) Outcome: Progressing   Problem: Activity: Goal: Will verbalize the importance of balancing activity with adequate rest periods Outcome: Progressing Goal: Ability to tolerate increased activity will  improve Outcome: Progressing   Problem: Coping: Goal: Ability to identify and utilize available resources and services will improve Outcome: Progressing   Problem: Life Cycle: Goal: Chance of risk for complications during the postpartum period will decrease Outcome: Progressing   Problem: Role Relationship: Goal: Ability to demonstrate positive interaction with newborn will improve Outcome: Progressing   Problem: Skin Integrity: Goal: Demonstration of wound healing without infection will improve Outcome: Progressing   Problem: Education: Goal: Knowledge of Childbirth will improve Outcome: Completed/Met Goal: Ability to make informed decisions regarding treatment and plan of care will improve Outcome: Completed/Met Goal: Ability to state and carry out methods to decrease the pain will improve Outcome: Completed/Met Goal: Individualized Educational Video(s) Outcome: Completed/Met   Problem: Coping: Goal: Ability to verbalize concerns and feelings about labor and delivery will improve Outcome: Completed/Met   Problem: Life Cycle: Goal: Ability to make normal progression through stages of labor will improve Outcome: Completed/Met Goal: Ability to effectively push during vaginal delivery will improve Outcome: Completed/Met   Problem: Role Relationship: Goal: Will demonstrate positive interactions with the child Outcome: Completed/Met   Problem: Safety: Goal: Risk of complications during labor and delivery will decrease Outcome: Completed/Met   Problem: Pain Management: Goal: Relief or control of pain from uterine contractions will improve Outcome: Completed/Met   

## 2022-04-18 NOTE — H&P (Addendum)
OBSTETRIC ADMISSION HISTORY AND PHYSICAL  Kelly Simpson is a 22 y.o. female G1P0000 with IUP at [redacted]w[redacted]d by 9w [place] presenting for labor. She reports +FMs, No LOF, no VB, no blurry vision, headaches or peripheral edema, and RUQ pain.  She plans on both bottle and breast feeding. She is interested in nepxlanon for birth control. She received her prenatal care at  [facility]: By 9wUS --->  Estimated Date of Delivery: 04/28/22  [place]:    @[redacted]w[redacted]d , CWD, normal anatomy, variable presentation , 76% EFW, Anterior Left lateral   Prenatal History/Complications:  - none  Past Medical History: Past Medical History:  Diagnosis Date   Medical history non-contributory     Past Surgical History: Past Surgical History:  Procedure Laterality Date   NO PAST SURGERIES      Obstetrical History: OB History     Gravida  1   Para  0   Term  0   Preterm  0   AB  0   Living  0      SAB  0   IAB  0   Ectopic  0   Multiple  0   Live Births  0           Social History Social History   Socioeconomic History   Marital status: Significant Other    Spouse name: Not on file   Number of children: Not on file   Years of education: 12   Highest education level: High school graduate  Occupational History   Occupation: Scientist, water quality    Comment: [employer]  Tobacco Use   Smoking status: Never   Smokeless tobacco: Never  Vaping Use   Vaping Use: Never used  Substance and Sexual Activity   Alcohol use: Never   Drug use: Never   Sexual activity: Yes    Birth control/protection: None  Other Topics Concern   Not on file  Social History Narrative   Not on file   Social Determinants of Health   Financial Resource Strain: Not on file  Food Insecurity: No Food Insecurity (04/18/2022)   Hunger Vital Sign    Worried About Running Out of Food in the Last Year: Never true    Ran Out of Food in the Last Year: Never true  Transportation Needs: No Transportation Needs  (04/18/2022)   PRAPARE - Hydrologist (Medical): No    Lack of Transportation (Non-Medical): No  Physical Activity: Not on file  Stress: Not on file  Social Connections: Not on file    Family History: History reviewed. No pertinent family history.  Allergies: No Known Allergies  Medications Prior to Admission  Medication Sig Dispense Refill Last Dose   Blood Pressure Monitoring (BLOOD PRESSURE KIT) DEVI 1 Device by Does not apply route once a week. (Patient not taking: Reported on 03/20/2022) 1 each 0    Prenatal 28-0.8 MG TABS Take 1 tablet by mouth daily. (Patient not taking: Reported on 02/19/2022) 30 tablet 12    Prenatal MV & Min w/FA-DHA (PRENATAL GUMMIES) 0.18-25 MG CHEW Chew 3 tablets by mouth daily. (Patient not taking: Reported on 02/19/2022) 90 tablet 12    Prenatal Vit-Fe Phos-FA-Omega (VITAFOL GUMMIES) 3.33-0.333-34.8 MG CHEW Chew 1 tablet by mouth daily. 90 tablet 5    Prenatal Vit-Fe Phos-FA-Omega (VITAFOL GUMMIES) 3.33-0.333-34.8 MG CHEW Chew 3 tablets by mouth daily. (Patient not taking: Reported on 02/19/2022) 90 tablet 5      Review of Systems  All systems reviewed and negative except as stated in HPI  Blood pressure 133/80, temperature 98.2 F (36.8 C), temperature source Oral, resp. rate 14, last menstrual period 07/27/2021, SpO2 99 %. General appearance: in pain with contractions Lungs: normal work of breathing Heart: normal rate   Presentation: cephalic Fetal monitoring: baseline 135, mod variability, + accel, - decel. Cat 1 Uterine activity: q 1-3 min Dilation: 8 Effacement (%): 100 Station: 0 Exam by:: Wendall Stade RN   Prenatal labs: ABO, Rh: --/--/PENDING (03/15 1410) Antibody: PENDING (03/15 1410) Rubella: 1.42 (10/04 0912) RPR: Non Reactive (01/03 0916)  HBsAg: Negative (10/04 0912)  HIV: Non Reactive (01/03 0916)  GBS: Negative/-- (02/28 1702)  2 hr Glucola 75/131/93 Genetic screening  NIPS low risk, Horizon  negative Anatomy US normal  Prenatal Transfer Tool  Maternal Diabetes: No Genetic Screening: Normal Maternal Ultrasounds/Referrals: Normal Fetal Ultrasounds or other Referrals:  None Maternal Substance Abuse:  No Significant Maternal Medications:  None Significant Maternal Lab Results:  None Number of Prenatal Visits:greater than 3 verified prenatal visits Other Comments:  None  Results for orders placed or performed during the hospital encounter of 04/18/22 (from the past 24 hour(s))  Type and screen [place]   Collection Time: 04/18/22  2:10 PM  Result Value Ref Range   ABO/RH(D) PENDING    Antibody Screen PENDING    Sample Expiration      04/21/2022,2359 Performed at [facility], [place] [address] [place], [place] [address]     Patient Active Problem List   Diagnosis Date Noted   Normal labor 04/18/2022   Supervision of other normal pregnancy, antepartum 09/26/2021    Assessment/Plan:  Kelly Simpson is a 22 y.o. G1P0000 at [redacted]w[redacted]d here for spontaneous labor  #Labor: AROM'd at 1430 with thick mec. Continue expectant management #Pain: Family support. Opted to defer epidural at this time given very close to second stage #FWB: Cat1  #ID:  GBS neg #MOF: both #MOC:maybe nexplanon #Circ:  N/A  Seen with Len Blalock, CNM. Jiayu "Darlyne Russian, M.D. PGY-2 Family Medicine Visiting Resident Faculty Practice 04/18/2022 2:28 PM   Attestation of Supervision of Student:  I confirm that I have verified the information documented in the  resident  student's note and that I have also personally reperformed the history, physical exam and all medical decision making activities.  I have verified that all services and findings are accurately documented in this student's note; and I agree with management and plan as outlined in the documentation. I have also made any necessary editorial changes.  Wende Mott, [place] for [employer], [place] [facility] 04/18/2022 2:39 PM

## 2022-04-19 LAB — CBC
HCT: 28.1 % — ABNORMAL LOW (ref 36.0–46.0)
Hemoglobin: 9.3 g/dL — ABNORMAL LOW (ref 12.0–15.0)
MCH: 25.8 pg — ABNORMAL LOW (ref 26.0–34.0)
MCHC: 33.1 g/dL (ref 30.0–36.0)
MCV: 77.8 fL — ABNORMAL LOW (ref 80.0–100.0)
Platelets: 191 10*3/uL (ref 150–400)
RBC: 3.61 MIL/uL — ABNORMAL LOW (ref 3.87–5.11)
RDW: 15.1 % (ref 11.5–15.5)
WBC: 15 10*3/uL — ABNORMAL HIGH (ref 4.0–10.5)
nRBC: 0 % (ref 0.0–0.2)

## 2022-04-19 LAB — RPR: RPR Ser Ql: NONREACTIVE

## 2022-04-19 MED ORDER — FERROUS SULFATE 325 (65 FE) MG PO TBEC: 325.0000 mg | DELAYED_RELEASE_TABLET | ORAL | 0 refills | Status: AC

## 2022-04-19 MED ORDER — IBUPROFEN 600 MG PO TABS: 600.0000 mg | ORAL_TABLET | Freq: Four times a day (QID) | ORAL | 0 refills | Status: AC

## 2022-04-19 MED ORDER — ACETAMINOPHEN 325 MG PO TABS: 650.0000 mg | ORAL_TABLET | ORAL | 0 refills | Status: AC | PRN

## 2022-04-19 NOTE — Discharge Summary (Signed)
Postpartum Discharge Summary  Patient Name: Kelly Simpson DOB: 16-Jan-2001 MRN: [id number]  Date of admission: 04/18/2022 Delivery date:04/18/2022  Delivering provider: Wende Mott  Date of discharge: 04/20/2022  Admitting diagnosis: Normal labor [O80, Z37.9] Intrauterine pregnancy: [redacted]w[redacted]d     Secondary diagnosis:  Active Problems:   SVD (spontaneous vaginal delivery)  Additional problems: None    Discharge diagnosis: Term Pregnancy Delivered                                              Post partum procedures: None Augmentation: AROM Complications: None  Hospital course: Onset of Labor With Vaginal Delivery      22 y.o. yo G1P0000 at [redacted]w[redacted]d was admitted in Active Labor on 04/18/2022. Labor course was complicated by thick meconium stained fluid  Membrane Rupture Time/Date: 2:22 PM ,04/18/2022   Delivery Method:Vaginal, Spontaneous  Episiotomy: None  Lacerations:    Patient had an uncomplicated postpartum course. She is ambulating, tolerating a regular diet, passing flatus, and urinating well. Patient is discharged home in stable condition on 04/20/22.  Newborn Data: Birth date:04/18/2022  Birth time:6:28 PM  Gender:Female  Living status:Living  Apgars:9 ,9  Weight:2778 g   Magnesium Sulfate received: No BMZ received: No Rhophylac:N/A MMR:N/A T-DaP: declined Flu: No Transfusion:No  Physical exam  Vitals:   04/19/22 1615 04/19/22 2004 04/19/22 2005 04/19/22 2327  BP: 112/64 98/76  (!) 95/54  Pulse: 84 (!) 102  66  Resp: 16 18  16   Temp: 97.8 F (36.6 C) 98.2 F (36.8 C)  97.8 F (36.6 C)  TempSrc: Oral Oral  Oral  SpO2: 99% 100% 95% 100%  Weight:      Height:       General: alert, cooperative, and no distress Lochia: appropriate Uterine Fundus: firm Incision: N/A DVT Evaluation: No evidence of DVT seen on physical exam. No significant calf/ankle edema. Labs: Lab Results  Component Value Date   WBC 15.0 (H) 04/19/2022   HGB 9.3 (L)  04/19/2022   HCT 28.1 (L) 04/19/2022   MCV 77.8 (L) 04/19/2022   PLT 191 04/19/2022      Latest Ref Rng & Units 10/14/2021    5:38 PM  CMP  Glucose 70 - 99 mg/dL 92   BUN 6 - 20 mg/dL 5   Creatinine 0.44 - 1.00 mg/dL 0.40   Sodium 135 - 145 mmol/L 135   Potassium 3.5 - 5.1 mmol/L 3.3   Chloride 98 - 111 mmol/L 106   CO2 22 - 32 mmol/L 21   Calcium 8.9 - 10.3 mg/dL 9.4    Edinburgh Score:    04/19/2022    8:20 PM  Edinburgh Postnatal Depression Scale Screening Tool  I have been able to laugh and see the funny side of things. 0  I have looked forward with enjoyment to things. 0  I have blamed myself unnecessarily when things went wrong. 0  I have been anxious or worried for no good reason. 0  I have felt scared or panicky for no good reason. 0  Things have been getting on top of me. 0  I have been so unhappy that I have had difficulty sleeping. 0  I have felt sad or miserable. 0  I have been so unhappy that I have been crying. 0  The thought of harming myself has occurred to me. 0  Flavia Shipper Postnatal  Depression Scale Total 0     After visit meds:  Allergies as of 04/20/2022   No Known Allergies      Medication List     STOP taking these medications    Prenatal 28-0.8 MG Tabs   Prenatal Gummies 0.18-25 MG Chew       TAKE these medications    acetaminophen 325 MG tablet Commonly known as: Tylenol Take 2 tablets (650 mg total) by mouth every 4 (four) hours as needed for mild pain.   Blood Pressure Kit Devi 1 Device by Does not apply route once a week.   ferrous sulfate 325 (65 FE) MG EC tablet Take 1 tablet (325 mg total) by mouth every other day.   ibuprofen 600 MG tablet Commonly known as: ADVIL Take 1 tablet (600 mg total) by mouth every 6 (six) hours.   Vitafol Gummies 3.33-0.333-34.8 MG Chew Chew 1 tablet by mouth daily. What changed: Another medication with the same name was removed. Continue taking this medication, and follow the directions you  see here.       Discharge home in stable condition Infant Feeding: Bottle and Breast Infant Disposition:home with mother Discharge instruction: per After Visit Summary and Postpartum booklet. Activity: Advance as tolerated. Pelvic rest for 6 weeks.  Diet: routine diet Future Appointments: Future Appointments  Date Time Provider [place]  04/23/2022  3:30 PM Laury Deep, CNM [place] None   Follow up Visit:  [place] [facility] [place] [facility] Follow up in 4 week(s).   Specialty: Obstetrics and Gynecology Why: We will help you schedule your postpartum visit in 4 weeks Contact information: [address], [place] [phone]               Message routed to [facility] on 3/16 at 11:17am by myself Please schedule this patient for a In person postpartum visit in 4 weeks with the following provider: Any provider. Additional Postpartum F/U: n/a   Low risk pregnancy complicated by:  n/a Delivery mode:  Vaginal, Spontaneous  Anticipated Birth Control:  Undecided  04/20/2022 Florian Buff, MD

## 2022-04-19 NOTE — Anesthesia Postprocedure Evaluation (Signed)
Anesthesia Post Note  Patient: IT sales professional  Procedure(s) Performed: AN AD HOC LABOR EPIDURAL     Patient location during evaluation: Mother Baby Anesthesia Type: Epidural Level of consciousness: awake and alert and oriented Pain management: satisfactory to patient Vital Signs Assessment: post-procedure vital signs reviewed and stable Respiratory status: respiratory function stable Cardiovascular status: stable Postop Assessment: no headache, no backache, epidural receding, patient able to bend at knees, no signs of nausea or vomiting, adequate PO intake and able to ambulate Anesthetic complications: no   No notable events documented.  Last Vitals:  Vitals:   04/19/22 0807 04/19/22 0832  BP:  108/66  Pulse: 85 84  Resp: 18 16  Temp: 36.5 C 36.6 C  SpO2: 97% 99%    Last Pain:  Vitals:   04/19/22 0832  TempSrc: Axillary  PainSc:    Pain Goal:                   Kelly Simpson

## 2022-04-19 NOTE — Progress Notes (Signed)
Post Partum Day 1 Subjective: no complaints, up ad lib, voiding, tolerating PO, and + flatus  Objective: Blood pressure 113/68, pulse 71, temperature 97.7 F (36.5 C), temperature source Oral, resp. rate 20, height 5\' 2"  (1.575 m), weight 62.1 kg, last menstrual period 07/27/2021, SpO2 99 %, unknown if currently breastfeeding.  Physical Exam:  General: alert, cooperative, and mild distress Lochia: appropriate Uterine Fundus: firm DVT Evaluation: No evidence of DVT seen on physical exam.  Recent Labs    04/18/22 1411 04/19/22 0448  HGB 11.8* 9.3*  HCT 36.4 28.1*    Assessment/Plan: Postpartum - Contraception: undecided - MOF: breast - Rh status: Rh+ - Rubella status: RI - Dispo: anticipate discharge PPD2  Neonatal - Doing well   LOS: 1 day   Kelly Simpson 04/19/2022, 12:35 PM

## 2022-04-19 NOTE — Lactation Note (Signed)
This note was copied from a baby's chart. Lactation Consultation Note  Patient Name: Kelly Simpson [id number] Date: 04/19/2022 Age:22 years old Reason for consult: Initial assessment;Primapara;1st time breastfeeding;Early term 37-38.6wks;Breastfeeding assistance;Infant weight loss (2.02% WL) Infant was at 22 years old old.  P1 birth parent stated things were going well with breastfeeding, but she was unable to get anything when pumping.  LC checked the size of the flange and changed the flange size to #21.  The birth parent pumped and immediately saw milk in the bottle.  The birth parent had the infant latched to the left breast in the cradle position.  The infant was latched with her tongue down, lips flanged, sucking was rhythmic, and jaw extensions were noted.  LC reviewed cluster feeding, outpatient services, hand expression, and feeding frequency.  All questions were answered.   Infant Feeding Plan: Breastfeed 8+ times in 24 hours according to feeding cues.  Pump after feedings and feed expressed milk to the infant.  Put the infant to the breast prior to supplementing.  Call [place] for assistance with breastfeeding.   Maternal Data Has patient been taught Hand Expression?: Yes Does the patient have breastfeeding experience prior to this delivery?: No  Feeding Mother's Current Feeding Choice: Breast Milk and Formula  LATCH Score Latch: Grasps breast easily, tongue down, lips flanged, rhythmical sucking.  Audible Swallowing: A few with stimulation  Type of Nipple: Everted at rest and after stimulation  Comfort (Breast/Nipple): Soft / non-tender  Hold (Positioning): Assistance needed to correctly position infant at breast and maintain latch.  LATCH Score: 8   Lactation Tools Discussed/Used Tools: Flanges Flange Size: 21  Interventions Interventions: Breast feeding basics reviewed;Assisted with latch;Breast massage;Hand express;Adjust position;Support  pillows;Education;LC Services brochure  Discharge    Consult Status Consult Status: Follow-up Date: 04/20/22 Follow-up type: In-patient    Lysbeth Penner 04/19/2022, 3:16 PM

## 2022-04-20 NOTE — Plan of Care (Signed)
Pt discharged home with printed instructions. No concerns noted. Toya Smothers, RN

## 2022-04-20 NOTE — Lactation Note (Signed)
This note was copied from a baby's chart. Lactation Consultation Note  Patient Name: Girl Clothilde Toriz [id number] Date: 04/20/2022 Age:20 hours Reason for consult: Follow-up assessment;Early term 37-38.6wks;1st time breastfeeding  P1, Mother is breastfeeding and offering formula.  She states baby is sleepy at the breast.  Attempted and baby did not wake to feed.  Mother states she plans to purchase DEBP. Reviewed how to use manual pump and milk storage guidelines.  Feed on demand with cues.  Goal 8-12+ times per day after first 24 hrs.  Place baby STS if not cueing.  Reviewed engorgement care and monitoring voids/stools.  Feeding Mother's Current Feeding Choice: Breast Milk and Formula Nipple Type: Slow - flow  Interventions Interventions: Breast feeding basics reviewed;Assisted with latch;Hand express;Education;Hand pump;DEBP  Discharge Discharge Education: Engorgement and breast care;Warning signs for feeding baby Pump:  (Mother plans to purchase pump, her insurance does not qualify for stork pump)  Consult Status Consult Status: Complete Date: 04/20/22    Vivianne Master [facility] 04/20/2022, 1:04 PM

## 2022-04-23 ENCOUNTER — Encounter: Payer: Medicaid Other | Admitting: Obstetrics and Gynecology

## 2022-04-30 ENCOUNTER — Telehealth (HOSPITAL_COMMUNITY): Payer: Self-pay | Admitting: *Deleted

## 2022-04-30 NOTE — Telephone Encounter (Signed)
Voicemail not setup. Unable to leave message.  Odis Hollingshead, RN 04/30/2022 at 2:27pm

## 2022-05-27 ENCOUNTER — Ambulatory Visit (INDEPENDENT_AMBULATORY_CARE_PROVIDER_SITE_OTHER): Payer: Medicaid Other | Admitting: Obstetrics and Gynecology

## 2022-05-27 ENCOUNTER — Encounter: Payer: Self-pay | Admitting: Obstetrics and Gynecology

## 2022-05-27 DIAGNOSIS — Z3202 Encounter for pregnancy test, result negative: Secondary | ICD-10-CM

## 2022-05-27 DIAGNOSIS — Z30017 Encounter for initial prescription of implantable subdermal contraceptive: Secondary | ICD-10-CM | POA: Diagnosis not present

## 2022-05-27 LAB — POCT URINE PREGNANCY: Preg Test, Ur: NEGATIVE

## 2022-05-27 MED ORDER — ETONOGESTREL 68 MG ~~LOC~~ IMPL
68.0000 mg | DRUG_IMPLANT | Freq: Once | SUBCUTANEOUS | Status: AC
  Administered 2022-05-27: 68 mg via SUBCUTANEOUS

## 2022-05-27 NOTE — Progress Notes (Addendum)
UPT today is Negative.  Nexplanon inserted into Left arm.  Administrations This Visit     etonogestrel (NEXPLANON) implant 68 mg     Admin Date 05/27/2022 Action Given Dose 68 mg Route Subdermal Administered By Maretta Bees, RMA

## 2022-05-27 NOTE — Progress Notes (Signed)
     GYNECOLOGY OFFICE PROCEDURE NOTE  Kelly Simpson is a 22 y.o. G1P1001 here for  Nexplanon insertion.   No other gynecologic concerns.  Nexplanon Insertion Procedure Patient identified, informed consent performed, consent signed.   Patient does understand that irregular bleeding is a very common side effect of this medication. She was advised to have backup contraception for one week after placement. Pregnancy test in clinic today was negative.  Appropriate time out taken.  Patient's left arm was prepped and draped in the usual sterile fashion. The ruler used to measure and mark insertion area.  Patient was prepped with alcohol swab and then injected with 3 ml of 1% lidocaine.  She was prepped with betadine, Nexplanon removed from packaging,  Device confirmed in needle, then inserted full length of needle and withdrawn per handbook instructions. Nexplanon was able to palpated in the patient's arm; patient palpated the insert herself. There was minimal blood loss.  Patient insertion site covered with guaze and a pressure bandage to reduce any bruising.  The patient tolerated the procedure well and was given post procedure instructions.     Mariel Aloe, MD, FACOG Obstetrician & Gynecologist, [facility] for [facility], [facility]

## 2022-05-27 NOTE — Progress Notes (Signed)
Pt declines BC at this time.  Post Partum Visit Note  Kelly Simpson is a 22 y.o. G62P1001 female who presents for a postpartum visit. She is 6 weeks postpartum following a normal spontaneous vaginal delivery.  I have fully reviewed the prenatal and intrapartum course. The delivery was at 38.4 gestational weeks.  Anesthesia: epidural. Postpartum course has been unremarkable. Baby is doing well. Baby is feeding by breast. Bleeding thin lochia. Bowel function is normal. Bladder function is normal. Patient is not sexually active. Contraception method is none. Postpartum depression screening: negative.   Upstream - 05/27/22 0933       Pregnancy Intention Screening   Does the patient want to become pregnant in the next year? No    Does the patient's partner want to become pregnant in the next year? No    Would the patient like to discuss contraceptive options today? No      Contraception Wrap Up   Current Method Abstinence            The pregnancy intention screening data noted above was reviewed. Potential methods of contraception were discussed. The patient elected to proceed with No data recorded.   Edinburgh Postnatal Depression Scale - 05/27/22 0933       Edinburgh Postnatal Depression Scale:  In the Past 7 Days   I have been able to laugh and see the funny side of things. 0    I have looked forward with enjoyment to things. 0    I have blamed myself unnecessarily when things went wrong. 0    I have been anxious or worried for no good reason. 0    I have felt scared or panicky for no good reason. 0    Things have been getting on top of me. 0    I have been so unhappy that I have had difficulty sleeping. 0    I have felt sad or miserable. 0    I have been so unhappy that I have been crying. 0    The thought of harming myself has occurred to me. 0    Edinburgh Postnatal Depression Scale Total 0             Health Maintenance Due  Topic Date Due   COVID-19  Vaccine (1) Never done   HPV VACCINES (1 - 2-dose series) Never done   DTaP/Tdap/Td (1 - Tdap) Never done    The following portions of the patient's history were reviewed and updated as appropriate: allergies, current medications, past family history, past medical history, past social history, past surgical history, and problem list.  Review of Systems Pertinent items are noted in HPI.  Objective:  BP 120/78   Pulse 68   Ht  (1.575 m)   Wt 127 lb (57.6 kg)   LMP 07/27/2021 (Exact Date)   Breastfeeding Yes   BMI 23.23 kg/m    General:  alert, cooperative, and no distress   Breasts:  not indicated  Lungs: clear to auscultation bilaterally  Heart:  regular rate and rhythm  Abdomen: soft, non-tender; bowel sounds normal; no masses,  no organomegaly   Wound N/a  GU exam:  not indicated       Assessment:    Encounter of postpartum care  Normal postpartum exam.   Plan:   Essential components of care per ACOG recommendations:  1.  Mood and well being: Patient with negative depression screening today. Reviewed local resources for support.  - Patient tobacco use?  No.   - hx of drug use? No.    2. Infant care and feeding:  -Patient currently breastmilk feeding? Yes. Reviewed importance of draining breast regularly to support lactation.  -Social determinants of health (SDOH) reviewed in EPIC. No concerns.  3. Sexuality, contraception and birth spacing - Patient does not want a pregnancy in the next year.  Desired family size is 2 children.  - Reviewed reproductive life planning. Reviewed contraceptive methods based on pt preferences and effectiveness.  Patient desired Hormonal Implant today.   - Discussed birth spacing of 18 months  4. Sleep and fatigue -Encouraged family/partner/community support of 4 hrs of uninterrupted sleep to help with mood and fatigue  5. Physical Recovery  - Discussed patients delivery and complications. She describes her labor as good. -  Patient had a Vaginal, no problems at delivery. Patient had a  no  laceration. Perineal healing reviewed. Patient expressed understanding - Patient has urinary incontinence? No. - Patient is safe to resume physical and sexual activity  6.  Health Maintenance - HM due items addressed Yes - Last pap smear No results found for: "DIAGPAP" Pap smear not done at today's visit. Pt is 22 yo -Breast Cancer screening indicated? No.   7. Chronic Disease/Pregnancy Condition follow up: None  - PCP follow up  F/u in 1 year for annual  Center for [employer], [facility]

## 2022-08-21 ENCOUNTER — Ambulatory Visit (INDEPENDENT_AMBULATORY_CARE_PROVIDER_SITE_OTHER): Payer: Medicaid Other | Admitting: Student

## 2022-08-21 ENCOUNTER — Encounter: Payer: Self-pay | Admitting: Student

## 2022-08-21 VITALS — BP 127/81 | HR 82 | Ht 61.0 in | Wt 132.6 lb

## 2022-08-21 DIAGNOSIS — Z975 Presence of (intrauterine) contraceptive device: Secondary | ICD-10-CM

## 2022-08-21 NOTE — Progress Notes (Signed)
  History:  Ms. Kelly Simpson is a 22 y.o. G1P1001 who presents to clinic today for concern related to Nexplanon that was placed this past April. States that it feels "weird" and sometimes it feels tender around the area. Reports a small bruise at the insertion site 1 week ago that has now resolved. Patient states that she may have slept on her arm wrong.   The following portions of the patient's history were reviewed and updated as appropriate: allergies, current medications, family history, past medical history, social history, past surgical history and problem list.  Review of Systems:  Review of Systems  All other systems reviewed and are negative.    Objective:  Physical Exam BP 127/81   Pulse 82   Ht 5\' 1"  (1.549 m)   Wt 132 lb 9.6 oz (60.1 kg)   BMI 25.05 kg/m  Physical Exam Nursing note reviewed.  Constitutional:      Appearance: Normal appearance.  Cardiovascular:     Rate and Rhythm: Normal rate.  Pulmonary:     Effort: Pulmonary effort is normal.  Musculoskeletal:        General: Normal range of motion.  Skin:    General: Skin is warm and dry.     Findings: No bruising or erythema.  Neurological:     General: No focal deficit present.     Mental Status: She is alert and oriented to person, place, and time. Mental status is at baseline.  Psychiatric:        Mood and Affect: Mood normal.        Behavior: Behavior normal.        Thought Content: Thought content normal.        Judgment: Judgment normal.     Labs and Imaging No results found for this or any previous visit (from the past 24 hour(s)).  No results found.  Health Maintenance Due  Topic Date Due   HPV VACCINES (1 - 3-dose series) Never done   DTaP/Tdap/Td (1 - Tdap) Never done   COVID-19 Vaccine (1 - 2023-24 season) Never done    Labs, imaging and previous visits in Epic and Care Everywhere reviewed  Assessment & Plan:  1. Nexplanon in place - Reassurance provided - Discussed the  benefits of easily palpable implant such as removal and reassurance it has not migrated - Offered patient the option to utilize [employer] and Nurse Line for follow-up questions from today's visit   Approximately 10 minutes of total time was spent with this patient on counseling of contraception method  No follow-ups on file.  Corlis Hove, NP 08/21/2022 4:43 PM

## 2022-08-21 NOTE — Progress Notes (Signed)
Pt presents for Nexplanon check. Nexplanon placed 05/2022. Pt reports pain and bruising at site and spotting.

## 2023-12-19 IMAGING — US US ABDOMEN LIMITED
1 series · 14 of 25 positions shown · non-contrast
Comparison: None.

CLINICAL DATA: Epigastric pain

EXAM:
ULTRASOUND ABDOMEN LIMITED RIGHT UPPER QUADRANT

[Series 1: us abdomen limited ruq (liver/gb) · 14 of 55 slices shown]
[im 1/55]
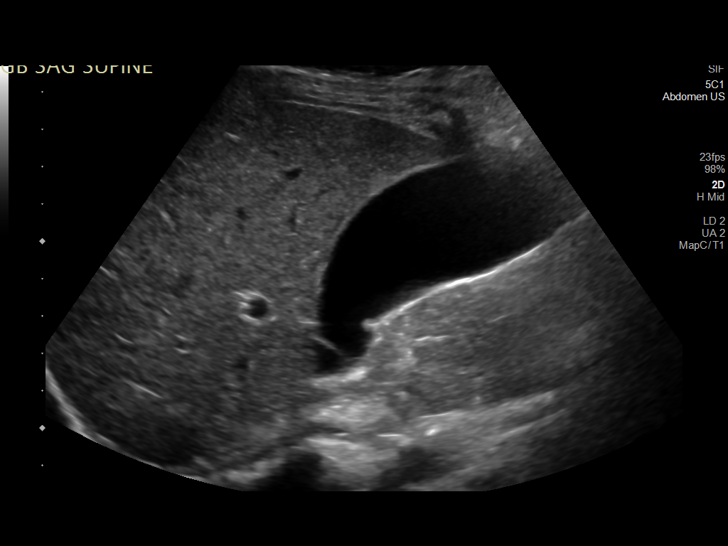
[im 5/55]
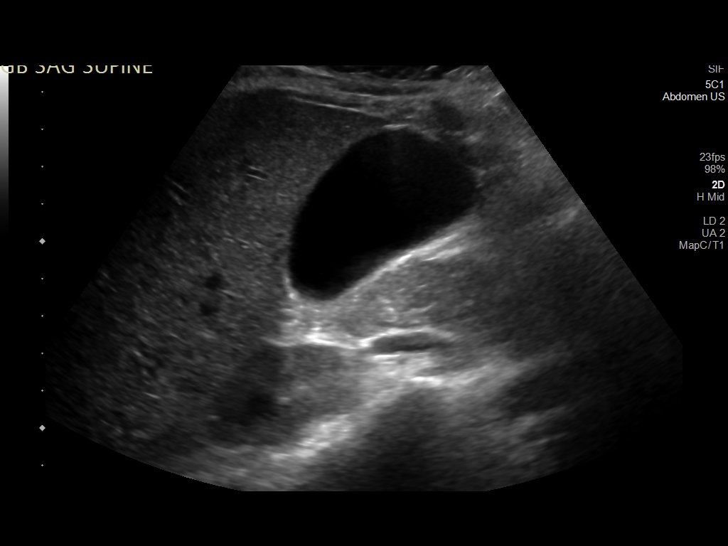
[im 10/55]
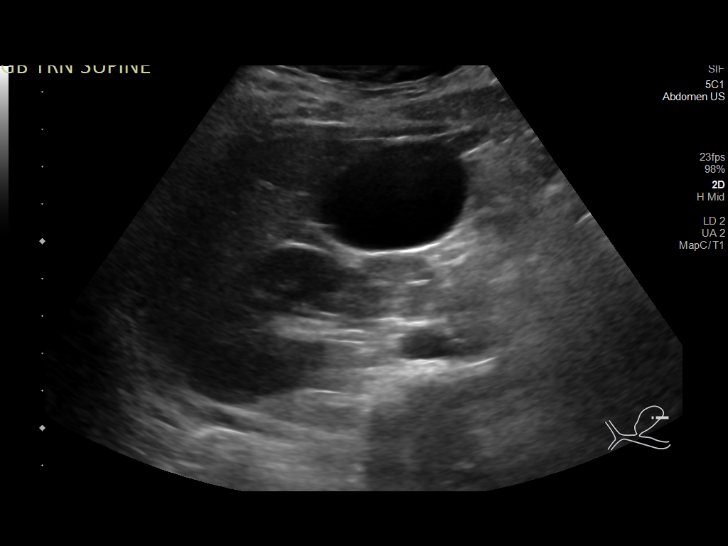
[im 14/55]
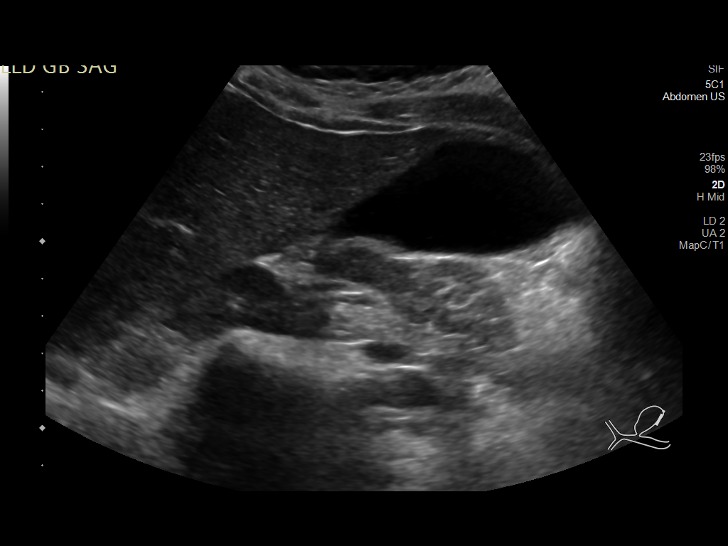
[im 19/55]
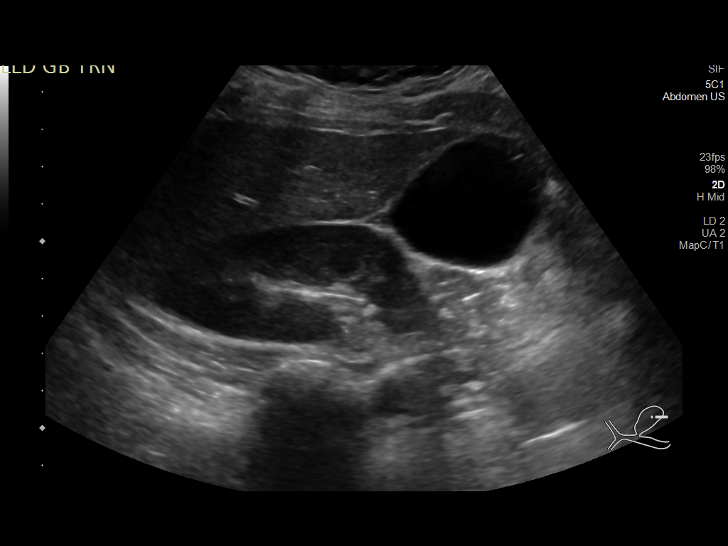
[im 21/55]
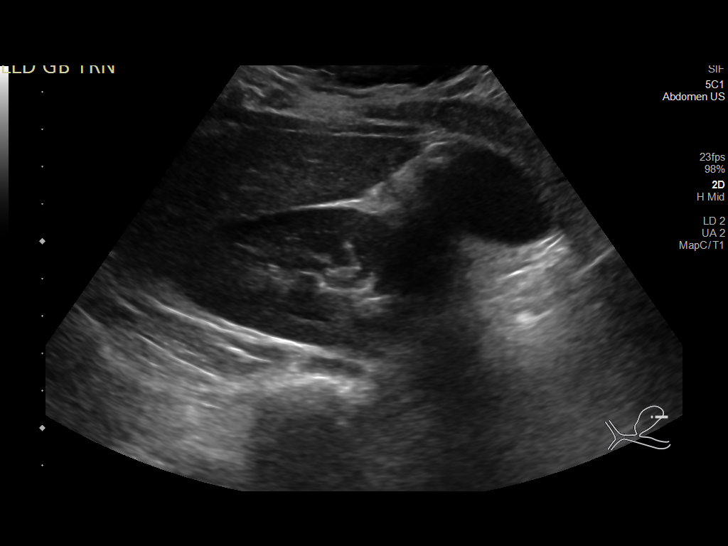
[im 25/55]
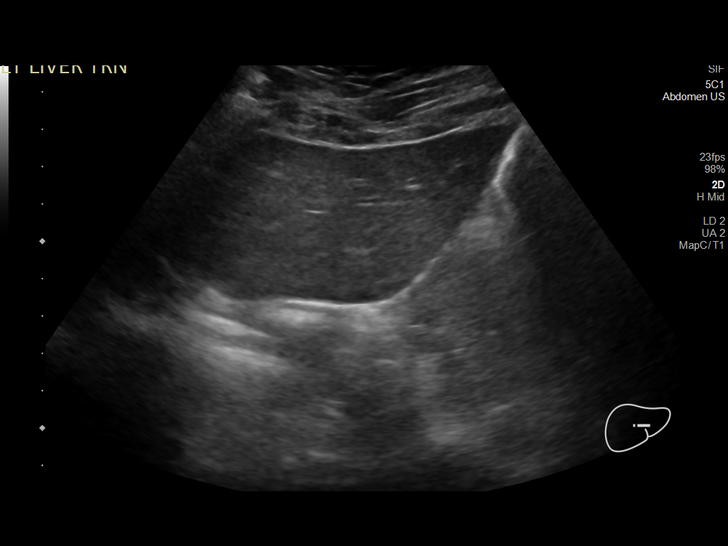
[im 30/55]
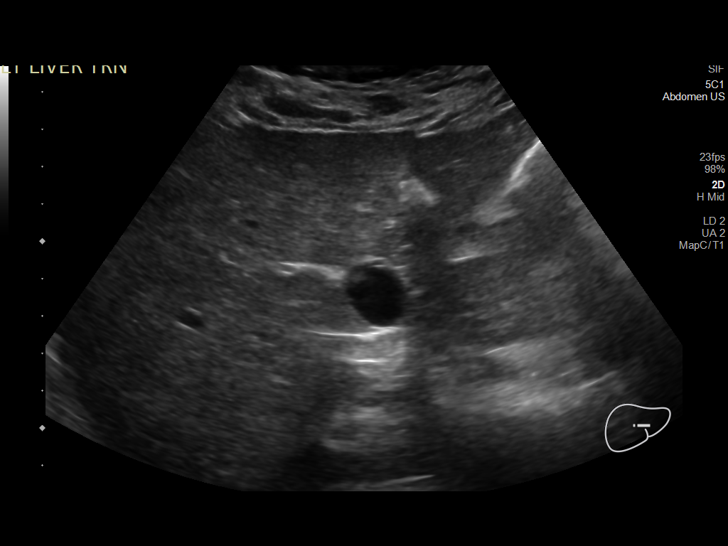
[im 34/55]
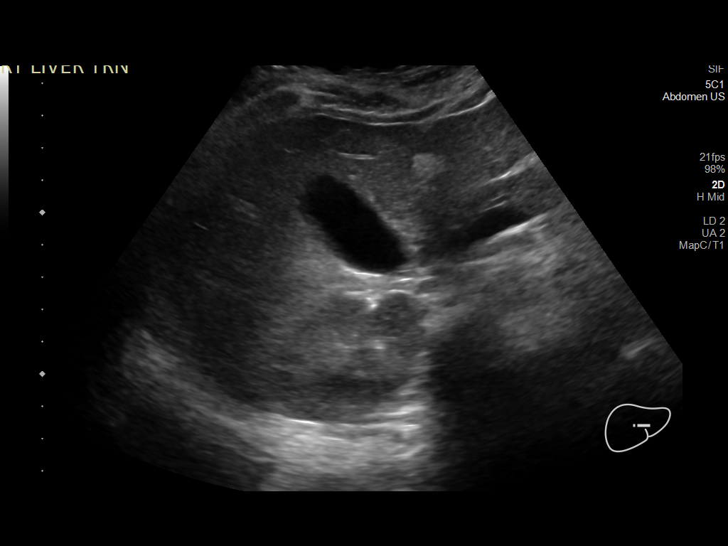
[im 37/55]
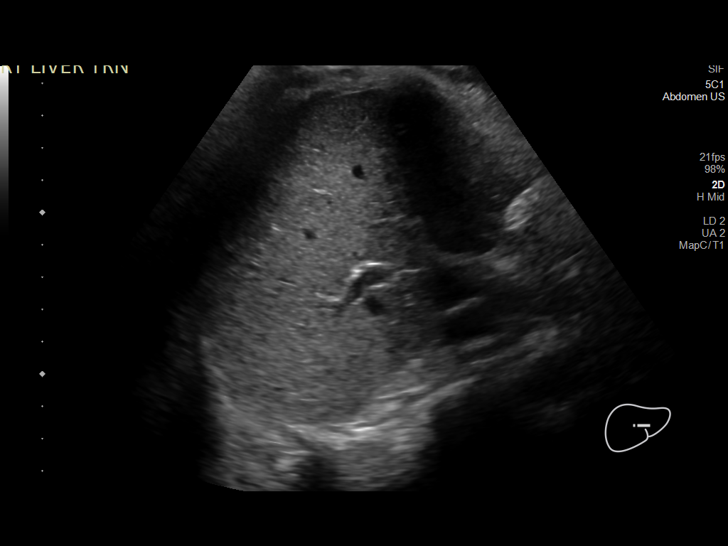
[im 41/55]
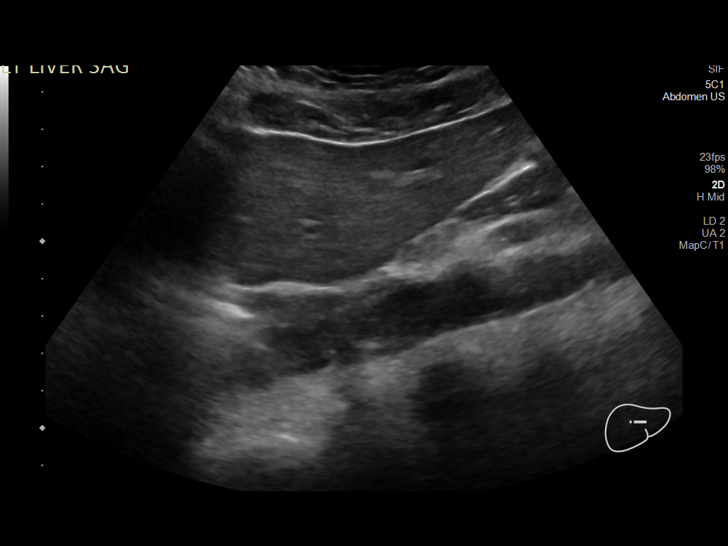
[im 46/55]
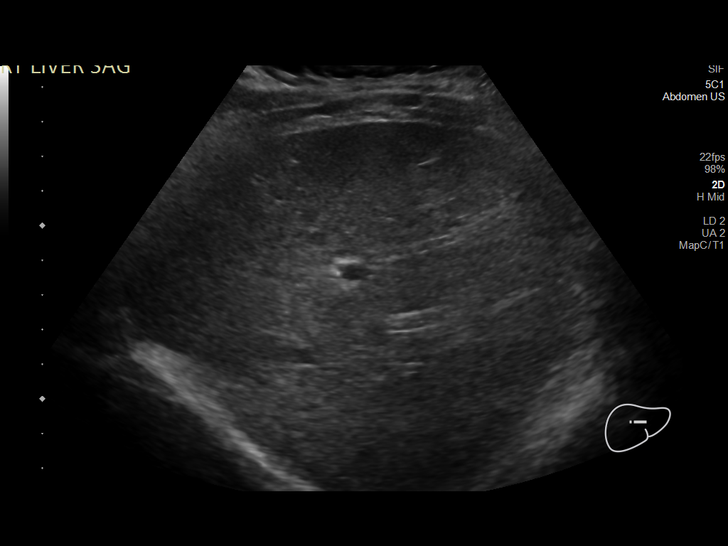
[im 50/55]
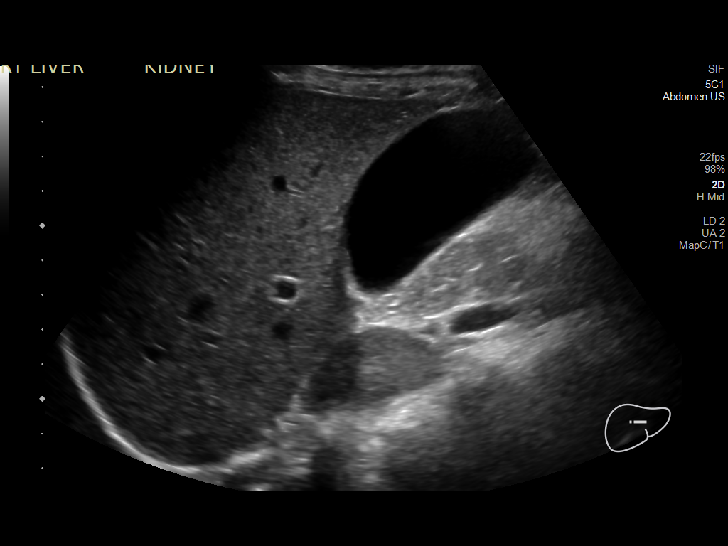
[im 55/55]
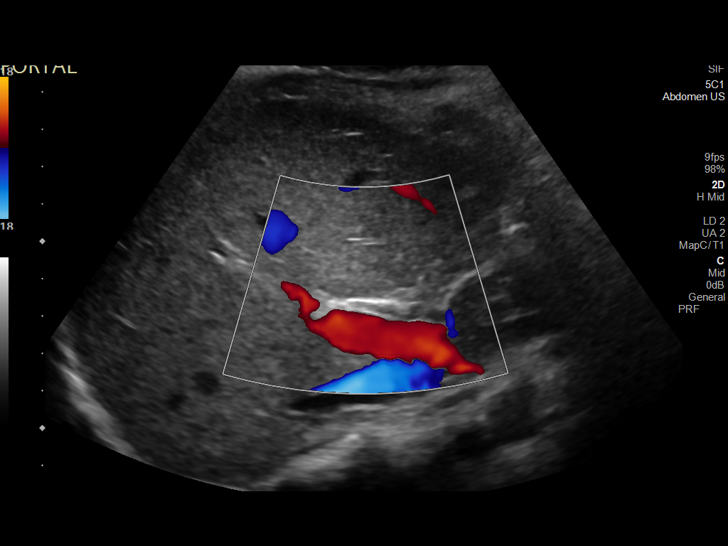

[14 of 25 positions shown; findings below may reference images not displayed]

FINDINGS: Gallbladder:

No gallstones or wall thickening visualized. No sonographic Murphy
sign noted by sonographer.

Common bile duct:

Diameter: 4.0 mm

Liver:

No focal lesion identified. Within normal limits in parenchymal
echogenicity. Portal vein is patent on color Doppler imaging with
normal direction of blood flow towards the liver.

Other: None.
IMPRESSION: Negative right upper quadrant ultrasound.
